# Patient Record
Sex: Male | Born: 1985 | Race: White | Hispanic: No | Marital: Single | Smoking: Current every day smoker
Health system: Southern US, Community
[De-identification: ages and names within clinical notes are randomized; demographics above are authoritative.]

## PROBLEM LIST (undated history)

## (undated) DIAGNOSIS — F419 Anxiety disorder, unspecified: Secondary | ICD-10-CM

## (undated) DIAGNOSIS — F32A Depression, unspecified: Secondary | ICD-10-CM

## (undated) DIAGNOSIS — F329 Major depressive disorder, single episode, unspecified: Secondary | ICD-10-CM

## (undated) DIAGNOSIS — F319 Bipolar disorder, unspecified: Secondary | ICD-10-CM

## (undated) DIAGNOSIS — T7840XA Allergy, unspecified, initial encounter: Secondary | ICD-10-CM

## (undated) HISTORY — DX: Allergy, unspecified, initial encounter: T78.40XA

---

## 2010-07-12 ENCOUNTER — Emergency Department: Payer: Self-pay | Admitting: Emergency Medicine

## 2010-11-16 ENCOUNTER — Emergency Department: Payer: Self-pay | Admitting: Emergency Medicine

## 2014-10-18 ENCOUNTER — Emergency Department
Admission: EM | Admit: 2014-10-18 | Discharge: 2014-10-18 | Disposition: A | Discharge status: Home / Self Care | Payer: Self-pay | Attending: Emergency Medicine | Admitting: Emergency Medicine

## 2014-10-18 ENCOUNTER — Encounter: Payer: Self-pay | Admitting: Emergency Medicine

## 2014-10-18 DIAGNOSIS — Z7251 High risk heterosexual behavior: Secondary | ICD-10-CM

## 2014-10-18 DIAGNOSIS — Z7252 High risk homosexual behavior: Secondary | ICD-10-CM | POA: Insufficient documentation

## 2014-10-18 DIAGNOSIS — N3001 Acute cystitis with hematuria: Secondary | ICD-10-CM | POA: Insufficient documentation

## 2014-10-18 DIAGNOSIS — Z720 Tobacco use: Secondary | ICD-10-CM | POA: Insufficient documentation

## 2014-10-18 HISTORY — DX: Anxiety disorder, unspecified: F41.9

## 2014-10-18 HISTORY — DX: Major depressive disorder, single episode, unspecified: F32.9

## 2014-10-18 HISTORY — DX: Bipolar disorder, unspecified: F31.9

## 2014-10-18 HISTORY — DX: Depression, unspecified: F32.A

## 2014-10-18 LAB — CHLAMYDIA/NGC RT PCR (ARMC ONLY)
Chlamydia Tr: NOT DETECTED
N gonorrhoeae: DETECTED — AB

## 2014-10-18 LAB — URINALYSIS COMPLETE WITH MICROSCOPIC (ARMC ONLY)
BACTERIA UA: NONE SEEN
Bilirubin Urine: NEGATIVE
Glucose, UA: NEGATIVE mg/dL
HGB URINE DIPSTICK: NEGATIVE
Ketones, ur: NEGATIVE mg/dL
Nitrite: NEGATIVE
PH: 5 (ref 5.0–8.0)
Protein, ur: 30 mg/dL — AB
RBC / HPF: NONE SEEN RBC/hpf (ref 0–5)
Specific Gravity, Urine: 1.025 (ref 1.005–1.030)

## 2014-10-18 MED ORDER — LIDOCAINE HCL (PF) 1 % IJ SOLN
2.1000 mL | Freq: Once | INTRAMUSCULAR | Status: AC
  Administered 2014-10-18: 2.1 mL
  Filled 2014-10-18: qty 5

## 2014-10-18 MED ORDER — CEFTRIAXONE SODIUM 1 G IJ SOLR
500.0000 mg | Freq: Once | INTRAMUSCULAR | Status: AC
  Administered 2014-10-18: 500 mg via INTRAMUSCULAR
  Filled 2014-10-18: qty 10

## 2014-10-18 MED ORDER — AZITHROMYCIN 250 MG PO TABS
1000.0000 mg | ORAL_TABLET | Freq: Once | ORAL | Status: AC
  Administered 2014-10-18: 1000 mg via ORAL
  Filled 2014-10-18: qty 4

## 2014-10-18 NOTE — ED Provider Notes (Signed)
[facility] Emergency Department Provider Note ____________________________________________  Time seen: Approximately 1:33 PM  I have reviewed the triage vital signs and the nursing notes.   HISTORY  Chief Complaint Urinary Frequency  HPI Allen Clay is a 29 y.o. male who presents to the ER for dysuria x 2 weeks. He had unprotected intercourse with a male about  3 weeks ago. Symptoms started shortly thereafter. No fever or back pain.    Past Medical History  Diagnosis Date  . Anxiety   . Depression   . Bipolar 1 disorder     There are no active problems to display for this patient.   History reviewed. No pertinent past surgical history.  No current outpatient prescriptions on file.  Allergies Review of patient's allergies indicates no known allergies.  History reviewed. No pertinent family history.  Social History Social History  Substance Use Topics  . Smoking status: Current Every Day Smoker  . Smokeless tobacco: None  . Alcohol Use: Yes    Review of Systems Constitutional: No fever/chills Eyes: No visual changes. ENT: No sore throat. Cardiovascular: Denies chest pain. Respiratory: Denies shortness of breath. Gastrointestinal: No abdominal pain.  No nausea, no vomiting.  No diarrhea.  No constipation. Genitourinary: Positive for dysuria. Musculoskeletal: Negative for back pain. Skin: Negative for rash. Neurological: Negative for headaches, focal weakness or numbness.  10-point ROS otherwise negative.  ____________________________________________   PHYSICAL EXAM:  VITAL SIGNS: ED Triage Vitals  Enc Vitals Group     BP 10/18/14 1113 127/79 mmHg     Pulse Rate 10/18/14 1113 96     Resp 10/18/14 1113 20     Temp 10/18/14 1113 98.4 F (36.9 C)     Temp Source 10/18/14 1113 Oral     SpO2 10/18/14 1113 99 %     Weight 10/18/14 1113 145 lb (65.772 kg)     Height 10/18/14 1113 5\' 6"  (1.676 m)     Head Cir --      Peak Flow  --      Pain Score 10/18/14 1115 0     Pain Loc --      Pain Edu? --      Excl. in GC? --     Constitutional: Alert and oriented. Well appearing and in no acute distress. Eyes: Conjunctivae are normal. PERRL. EOMI. Head: Atraumatic. Nose: No congestion/rhinnorhea. Mouth/Throat: Mucous membranes are moist.  Neck: No stridor.   Cardiovascular: Good peripheral circulation. Respiratory: Normal respiratory effort.  No retractions. Gastrointestinal: Soft and nontender. No distention. No abdominal bruits. No CVA tenderness. Musculoskeletal: No lower extremity tenderness nor edema.  No joint effusions. Neurologic:  Normal speech and language. No gross focal neurologic deficits are appreciated. No gait instability. Skin:  Skin is warm, dry and intact. No rash noted. Psychiatric: Mood and affect are normal. Speech and behavior are normal.  ____________________________________________   LABS (all labs ordered are listed, but only abnormal results are displayed)  Labs Reviewed  URINALYSIS COMPLETEWITH MICROSCOPIC ([facility] ONLY) - Abnormal; Notable for the following:    Color, Urine YELLOW (*)    APPearance CLOUDY (*)    Protein, ur 30 (*)    Leukocytes, UA 3+ (*)    Squamous Epithelial / LPF 0-5 (*)    All other components within normal limits  CHLAMYDIA/NGC RT PCR ([facility] ONLY)  WBC too numerus to count. ____________________________________________  EKG   ____________________________________________  RADIOLOGY   ____________________________________________   PROCEDURES  Procedure(s) performed: None  Critical Care  performed: No  ____________________________________________   INITIAL IMPRESSION / ASSESSMENT AND PLAN / ED COURSE  Pertinent labs & imaging results that were available during my care of the patient were reviewed by me and considered in my medical decision making (see chart for details).  IM Rocephin and 1g Azithromycin given in the ER. Patient  advised of the [facility] ST[facility]. ____________________________________________   FINAL CLINICAL IMPRESSION(S) / ED DIAGNOSES  Final diagnoses:  Acute cystitis with hematuria  Unprotected sexual intercourse      Chinita Pester, FNP 10/18/14 1340  Sharyn Creamer, MD 10/18/14 [id number]

## 2014-10-18 NOTE — ED Notes (Signed)
Pt to ed with c/o burning with urination x 2 weeks.

## 2014-10-20 ENCOUNTER — Telehealth: Payer: Self-pay | Admitting: Emergency Medicine

## 2014-10-20 NOTE — ED Notes (Signed)
  Voicemail said name was Allen Clay, so did not leave a message.  Will send letter.  Would like to inform of positive gonorrhea test--patient was treated in the ED, but needs to be informed that partner treatment is needed and available at [facility] std clinic.

## 2015-02-16 DIAGNOSIS — J069 Acute upper respiratory infection, unspecified: Secondary | ICD-10-CM | POA: Insufficient documentation

## 2015-02-16 DIAGNOSIS — R42 Dizziness and giddiness: Secondary | ICD-10-CM | POA: Insufficient documentation

## 2015-02-16 DIAGNOSIS — F17200 Nicotine dependence, unspecified, uncomplicated: Secondary | ICD-10-CM | POA: Insufficient documentation

## 2015-02-16 LAB — POCT RAPID STREP A: Streptococcus, Group A Screen (Direct): NEGATIVE

## 2015-02-16 NOTE — ED Notes (Signed)
Sore throat x 2 days, congestion, and cough.

## 2015-02-17 ENCOUNTER — Emergency Department
Admission: EM | Admit: 2015-02-17 | Discharge: 2015-02-17 | Disposition: A | Discharge status: Home / Self Care | Payer: Self-pay | Attending: Emergency Medicine | Admitting: Emergency Medicine

## 2015-02-17 DIAGNOSIS — J069 Acute upper respiratory infection, unspecified: Secondary | ICD-10-CM

## 2015-02-17 DIAGNOSIS — J029 Acute pharyngitis, unspecified: Secondary | ICD-10-CM

## 2015-02-17 MED ORDER — BENZONATATE 100 MG PO CAPS: 100.0000 mg | ORAL_CAPSULE | Freq: Four times a day (QID) | ORAL | Status: AC | PRN

## 2015-02-17 NOTE — ED Provider Notes (Signed)
[facility] Emergency Department Provider Note  ____________________________________________  Time seen: Approximately 0058 AM  I have reviewed the triage vital signs and the nursing notes.   HISTORY  Chief Complaint Sore Throat    HPI Allen Clay is a 30 y.o. male who comes into the hospital today with a chest cold. He reports that his body feels weak and his nose is running faucet. The patient reports he's had these symptoms for the past 2 days he has not taken anything for it. The patient denies sick contacts but reports that he works and he is unsure if anyone was sick. The patient has not had any fevers and reports that when he stands he feels a little bit dizzy. He's been eating okay but reports that he only eats well every other day. He's been coughing up some dark mucus with some mild shortness of breath as well as a sore throat. The patient was concerned so he decided to come in to get his symptoms checked out.   Past Medical History  Diagnosis Date  . Anxiety   . Depression   . Bipolar 1 disorder     There are no active problems to display for this patient.   No past surgical history on file.  Current Outpatient Rx  Name  Route  Sig  Dispense  Refill  . benzonatate (TESSALON PERLES) 100 MG capsule   Oral   Take 1 capsule (100 mg total) by mouth every 6 (six) hours as needed for cough.   15 capsule   0     Allergies Review of patient's allergies indicates no known allergies.  No family history on file.  Social History Social History  Substance Use Topics  . Smoking status: Current Every Day Smoker  . Smokeless tobacco: Not on file  . Alcohol Use: Yes    Review of Systems Constitutional: No fever/chills Eyes: No visual changes. ENT:  sore throat. Cardiovascular: Denies chest pain. Respiratory: Cough with mild shortness of breath. Gastrointestinal: No abdominal pain.  No nausea, no vomiting.  No diarrhea.  No  constipation. Genitourinary: Negative for dysuria. Musculoskeletal: Negative for back pain. Skin: Negative for rash. Neurological: Generalized weakness and mild light headedness  10-point ROS otherwise negative.  ____________________________________________   PHYSICAL EXAM:  VITAL SIGNS: ED Triage Vitals  Enc Vitals Group     BP 02/16/15 2225 127/86 mmHg     Pulse Rate 02/16/15 2225 102     Resp 02/16/15 2225 18     Temp 02/16/15 2225 98.1 F (36.7 C)     Temp Source 02/16/15 2225 Oral     SpO2 02/16/15 2225 96 %     Weight 02/16/15 2225 145 lb (65.772 kg)     Height 02/16/15 2225 5\' 6"  (1.676 m)     Head Cir --      Peak Flow --      Pain Score 02/16/15 2227 7     Pain Loc --      Pain Edu? --      Excl. in GC? --     Constitutional: Alert and oriented. Well appearing and in no acute distress. Eyes: Conjunctivae are normal. PERRL. EOMI. Head: Atraumatic. Nose: No congestion/rhinnorhea. Mouth/Throat: Mucous membranes are moist.  Oropharynx non-erythematous. Cardiovascular: Normal rate, regular rhythm. Grossly normal heart sounds.  Good peripheral circulation. Lymph: anterior cervical lymphadenopathy Respiratory: Normal respiratory effort.  No retractions. Lungs CTAB. Gastrointestinal: Soft and nontender. No distention. Positive bowel sounds Musculoskeletal: No lower  extremity tenderness nor edema.   Neurologic:  Normal speech and language.  Skin:  Skin is warm, dry and intact.  Psychiatric: Mood and affect are normal.  ____________________________________________   LABS (all labs ordered are listed, but only abnormal results are displayed)  Labs Reviewed  POCT RAPID STREP A   ____________________________________________  EKG  none ____________________________________________  RADIOLOGY  none ____________________________________________   PROCEDURES  Procedure(s) performed: None  Critical Care performed:  No  ____________________________________________   INITIAL IMPRESSION / ASSESSMENT AND PLAN / ED COURSE  Pertinent labs & imaging results that were available during my care of the patient were reviewed by me and considered in my medical decision making (see chart for details).  This is a 30 year old male who comes into the hospital today with upper respiratory infection symptoms such as runny nose cough and some sore throat. The patient did have a strep test done that was negative. He reports that he's been having these symptoms for the past 2 days and has not taken anything to help with the symptoms. I feel the patient has an upper respiratory infection and he needs to rest and take by mouth fluid. I will give the patient some benzonatate for his cough and congestion and give him a note for work. Otherwise the patient will be discharged home he has no fevers and he is in no acute distress at this time. The patient be discharged home. He understands and verbalizes agreement with the plan as stated. ____________________________________________   FINAL CLINICAL IMPRESSION(S) / ED DIAGNOSES  Final diagnoses:  Upper respiratory infection  Sore throat      Rebecka Apley, MD 02/17/15 0127

## 2015-02-17 NOTE — Discharge Instructions (Signed)
Upper Respiratory Infection, Adult  Most upper respiratory infections (URIs) are a viral infection of the air passages leading to the lungs. A URI affects the nose, throat, and upper air passages. The most common type of URI is nasopharyngitis and is typically referred to as "the common cold."  URIs run their course and usually go away on their own. Most of the time, a URI does not require medical attention, but sometimes a bacterial infection in the upper airways can follow a viral infection. This is called a secondary infection. Sinus and middle ear infections are common types of secondary upper respiratory infections.  Bacterial pneumonia can also complicate a URI. A URI can worsen asthma and chronic obstructive pulmonary disease (COPD). Sometimes, these complications can require emergency medical care and may be life threatening.   CAUSES  Almost all URIs are caused by viruses. A virus is a type of germ and can spread from one person to another.   RISKS FACTORS  You may be at risk for a URI if:    You smoke.    You have chronic heart or lung disease.   You have a weakened defense (immune) system.    You are very young or very old.    You have nasal allergies or asthma.   You work in crowded or poorly ventilated areas.   You work in health care facilities or schools.  SIGNS AND SYMPTOMS   Symptoms typically develop 2-3 days after you come in contact with a cold virus. Most viral URIs last 7-10 days. However, viral URIs from the influenza virus (flu virus) can last 14-18 days and are typically more severe. Symptoms may include:    Runny or stuffy (congested) nose.    Sneezing.    Cough.    Sore throat.    Headache.    Fatigue.    Fever.    Loss of appetite.    Pain in your forehead, behind your eyes, and over your cheekbones (sinus pain).   Muscle aches.   DIAGNOSIS   Your health care provider may diagnose a URI by:   Physical exam.   Tests to check that your symptoms are not due to  another condition such as:   Strep throat.   Sinusitis.   Pneumonia.   Asthma.  TREATMENT   A URI goes away on its own with time. It cannot be cured with medicines, but medicines may be prescribed or recommended to relieve symptoms. Medicines may help:   Reduce your fever.   Reduce your cough.   Relieve nasal congestion.  HOME CARE INSTRUCTIONS    Take medicines only as directed by your health care provider.    Gargle warm saltwater or take cough drops to comfort your throat as directed by your health care provider.   Use a warm mist humidifier or inhale steam from a shower to increase air moisture. This may make it easier to breathe.   Drink enough fluid to keep your urine clear or pale yellow.    Eat soups and other clear broths and maintain good nutrition.    Rest as needed.    Return to work when your temperature has returned to normal or as your health care provider advises. You may need to stay home longer to avoid infecting others. You can also use a face mask and careful hand washing to prevent spread of the virus.   Increase the usage of your inhaler if you have asthma.    Do not   use any tobacco products, including cigarettes, chewing tobacco, or electronic cigarettes. If you need help quitting, ask your health care provider.  PREVENTION   The best way to protect yourself from getting a cold is to practice good hygiene.    Avoid oral or hand contact with people with cold symptoms.    Wash your hands often if contact occurs.   There is no clear evidence that vitamin C, vitamin E, echinacea, or exercise reduces the chance of developing a cold. However, it is always recommended to get plenty of rest, exercise, and practice good nutrition.   SEEK MEDICAL CARE IF:    You are getting worse rather than better.    Your symptoms are not controlled by medicine.    You have chills.   You have worsening shortness of breath.   You have brown or red mucus.   You have yellow or brown nasal  discharge.   You have pain in your face, especially when you bend forward.   You have a fever.   You have swollen neck glands.   You have pain while swallowing.   You have white areas in the back of your throat.  SEEK IMMEDIATE MEDICAL CARE IF:    You have severe or persistent:    Headache.    Ear pain.    Sinus pain.    Chest pain.   You have chronic lung disease and any of the following:    Wheezing.    Prolonged cough.    Coughing up blood.    A change in your usual mucus.   You have a stiff neck.   You have changes in your:    Vision.    Hearing.    Thinking.    Mood.  MAKE SURE YOU:    Understand these instructions.   Will watch your condition.   Will get help right away if you are not doing well or get worse.     This information is not intended to replace advice given to you by your health care provider. Make sure you discuss any questions you have with your health care provider.     Document Released: 07/20/2000 Document Revised: 06/10/2014 Document Reviewed: 05/01/2013  Elsevier Interactive Patient Education 2016 [employer]  Pharyngitis  Pharyngitis is redness, pain, and swelling (inflammation) of your pharynx.   CAUSES   Pharyngitis is usually caused by infection. Most of the time, these infections are from viruses (viral) and are part of a cold. However, sometimes pharyngitis is caused by bacteria (bacterial). Pharyngitis can also be caused by allergies. Viral pharyngitis may be spread from person to person by coughing, sneezing, and personal items or utensils (cups, forks, spoons, toothbrushes). Bacterial pharyngitis may be spread from person to person by more intimate contact, such as kissing.   SIGNS AND SYMPTOMS   Symptoms of pharyngitis include:    Sore throat.    Tiredness (fatigue).    Low-grade fever.    Headache.   Joint pain and muscle aches.   Skin rashes.   Swollen lymph nodes.   Plaque-like film on throat or tonsils (often seen with bacterial  pharyngitis).  DIAGNOSIS   Your health care provider will ask you questions about your illness and your symptoms. Your medical history, along with a physical exam, is often all that is needed to diagnose pharyngitis. Sometimes, a rapid strep test is done. Other lab tests may also be done, depending on the suspected cause.   TREATMENT   Viral   pharyngitis will usually get better in 3-4 days without the use of medicine. Bacterial pharyngitis is treated with medicines that kill germs (antibiotics).   HOME CARE INSTRUCTIONS    Drink enough water and fluids to keep your urine clear or pale yellow.    Only take over-the-counter or prescription medicines as directed by your health care provider:     If you are prescribed antibiotics, make sure you finish them even if you start to feel better.     Do not take aspirin.    Get lots of rest.    Gargle with 8 oz of salt water ( tsp of salt per 1 qt of water) as often as every 1-2 hours to soothe your throat.    Throat lozenges (if you are not at risk for choking) or sprays may be used to soothe your throat.  SEEK MEDICAL CARE IF:    You have large, tender lumps in your neck.   You have a rash.   You cough up green, yellow-brown, or bloody spit.  SEEK IMMEDIATE MEDICAL CARE IF:    Your neck becomes stiff.   You drool or are unable to swallow liquids.   You vomit or are unable to keep medicines or liquids down.   You have severe pain that does not go away with the use of recommended medicines.   You have trouble breathing (not caused by a stuffy nose).  MAKE SURE YOU:    Understand these instructions.   Will watch your condition.   Will get help right away if you are not doing well or get worse.     This information is not intended to replace advice given to you by your health care provider. Make sure you discuss any questions you have with your health care provider.     Document Released: 01/24/2005 Document Revised: 11/14/2012 Document Reviewed:  10/01/2012  Elsevier Interactive Patient Education 2016 [employer] Inc[employer]

## 2015-02-17 NOTE — ED Notes (Signed)
Patient discharged to home per MD order. Patient in stable condition, and deemed medically cleared by ED provider for discharge. Discharge instructions reviewed with patient/family using "Teach Back"; verbalized understanding of medication education and administration, and information about follow-up care. Denies further concerns. ° °

## 2015-03-30 ENCOUNTER — Encounter: Payer: Self-pay | Admitting: Emergency Medicine

## 2015-03-30 DIAGNOSIS — F17210 Nicotine dependence, cigarettes, uncomplicated: Secondary | ICD-10-CM | POA: Insufficient documentation

## 2015-03-30 DIAGNOSIS — R109 Unspecified abdominal pain: Secondary | ICD-10-CM | POA: Insufficient documentation

## 2015-03-30 LAB — URINALYSIS COMPLETE WITH MICROSCOPIC (ARMC ONLY)
BILIRUBIN URINE: NEGATIVE
Bacteria, UA: NONE SEEN
GLUCOSE, UA: NEGATIVE mg/dL
HGB URINE DIPSTICK: NEGATIVE
Ketones, ur: NEGATIVE mg/dL
LEUKOCYTES UA: NEGATIVE
NITRITE: NEGATIVE
Protein, ur: NEGATIVE mg/dL
RBC / HPF: NONE SEEN RBC/hpf (ref 0–5)
SPECIFIC GRAVITY, URINE: 1.014 (ref 1.005–1.030)
Squamous Epithelial / LPF: NONE SEEN
pH: 6 (ref 5.0–8.0)

## 2015-03-30 LAB — CBC
HCT: 41.1 % (ref 40.0–52.0)
Hemoglobin: 13.9 g/dL (ref 13.0–18.0)
MCH: 31.1 pg (ref 26.0–34.0)
MCHC: 33.8 g/dL (ref 32.0–36.0)
MCV: 91.8 fL (ref 80.0–100.0)
PLATELETS: 154 10*3/uL (ref 150–440)
RBC: 4.47 MIL/uL (ref 4.40–5.90)
RDW: 13 % (ref 11.5–14.5)
WBC: 5.9 10*3/uL (ref 3.8–10.6)

## 2015-03-30 LAB — COMPREHENSIVE METABOLIC PANEL
ALT: 17 U/L (ref 17–63)
AST: 25 U/L (ref 15–41)
Albumin: 4.1 g/dL (ref 3.5–5.0)
Alkaline Phosphatase: 43 U/L (ref 38–126)
Anion gap: 2 — ABNORMAL LOW (ref 5–15)
BILIRUBIN TOTAL: 0.7 mg/dL (ref 0.3–1.2)
BUN: 9 mg/dL (ref 6–20)
CO2: 31 mmol/L (ref 22–32)
CREATININE: 0.86 mg/dL (ref 0.61–1.24)
Calcium: 8.9 mg/dL (ref 8.9–10.3)
Chloride: 106 mmol/L (ref 101–111)
GFR calc Af Amer: >60 mL/min (ref >60)
Glucose, Bld: 95 mg/dL (ref 65–99)
Potassium: 4.2 mmol/L (ref 3.5–5.1)
Sodium: 139 mmol/L (ref 135–145)
TOTAL PROTEIN: 6.6 g/dL (ref 6.5–8.1)

## 2015-03-30 LAB — LIPASE, BLOOD: Lipase: 27 U/L (ref 11–51)

## 2015-03-30 NOTE — ED Notes (Addendum)
Patient ambulatory to triage with steady gait, without difficulty or distress noted; pt reports N/V/D since this morning with rt sided abd pain

## 2015-03-31 ENCOUNTER — Emergency Department
Admission: EM | Admit: 2015-03-31 | Discharge: 2015-03-31 | Disposition: A | Discharge status: Home / Self Care | Payer: Self-pay | Attending: Emergency Medicine | Admitting: Emergency Medicine

## 2016-07-28 ENCOUNTER — Emergency Department
Admission: EM | Admit: 2016-07-28 | Discharge: 2016-07-28 | Disposition: A | Discharge status: Home / Self Care | Payer: Self-pay | Attending: Emergency Medicine | Admitting: Emergency Medicine

## 2016-07-28 ENCOUNTER — Encounter: Payer: Self-pay | Admitting: Emergency Medicine

## 2016-07-28 ENCOUNTER — Emergency Department: Payer: Self-pay

## 2016-07-28 DIAGNOSIS — R52 Pain, unspecified: Secondary | ICD-10-CM

## 2016-07-28 DIAGNOSIS — R1013 Epigastric pain: Secondary | ICD-10-CM | POA: Insufficient documentation

## 2016-07-28 DIAGNOSIS — F17210 Nicotine dependence, cigarettes, uncomplicated: Secondary | ICD-10-CM | POA: Insufficient documentation

## 2016-07-28 LAB — URINALYSIS, COMPLETE (UACMP) WITH MICROSCOPIC
BACTERIA UA: NONE SEEN
Bilirubin Urine: NEGATIVE
Glucose, UA: NEGATIVE mg/dL
HGB URINE DIPSTICK: NEGATIVE
Ketones, ur: NEGATIVE mg/dL
Leukocytes, UA: NEGATIVE
NITRITE: NEGATIVE
PROTEIN: NEGATIVE mg/dL
RBC / HPF: NONE SEEN RBC/hpf (ref 0–5)
SPECIFIC GRAVITY, URINE: 1.024 (ref 1.005–1.030)
SQUAMOUS EPITHELIAL / LPF: NONE SEEN
pH: 5 (ref 5.0–8.0)

## 2016-07-28 LAB — COMPREHENSIVE METABOLIC PANEL
ALBUMIN: 4.5 g/dL (ref 3.5–5.0)
ALK PHOS: 36 U/L — AB (ref 38–126)
ALT: 17 U/L (ref 17–63)
AST: 30 U/L (ref 15–41)
Anion gap: 6 (ref 5–15)
BILIRUBIN TOTAL: 0.7 mg/dL (ref 0.3–1.2)
BUN: 11 mg/dL (ref 6–20)
CALCIUM: 9.2 mg/dL (ref 8.9–10.3)
CO2: 29 mmol/L (ref 22–32)
Chloride: 102 mmol/L (ref 101–111)
Creatinine, Ser: 1.15 mg/dL (ref 0.61–1.24)
GFR calc Af Amer: >60 mL/min (ref >60)
GFR calc non Af Amer: >60 mL/min (ref >60)
GLUCOSE: 90 mg/dL (ref 65–99)
POTASSIUM: 3.4 mmol/L — AB (ref 3.5–5.1)
SODIUM: 137 mmol/L (ref 135–145)
TOTAL PROTEIN: 7.3 g/dL (ref 6.5–8.1)

## 2016-07-28 LAB — CBC
HCT: 43.2 % (ref 40.0–52.0)
Hemoglobin: 15 g/dL (ref 13.0–18.0)
MCH: 31.9 pg (ref 26.0–34.0)
MCHC: 34.7 g/dL (ref 32.0–36.0)
MCV: 91.9 fL (ref 80.0–100.0)
Platelets: 178 10*3/uL (ref 150–440)
RBC: 4.7 MIL/uL (ref 4.40–5.90)
RDW: 12.9 % (ref 11.5–14.5)
WBC: 8.4 10*3/uL (ref 3.8–10.6)

## 2016-07-28 LAB — LIPASE, BLOOD: Lipase: 25 U/L (ref 11–51)

## 2016-07-28 MED ORDER — DICYCLOMINE HCL 20 MG PO TABS
20.0000 mg | ORAL_TABLET | Freq: Once | ORAL | Status: AC
  Administered 2016-07-28: 20 mg via ORAL
  Filled 2016-07-28 (×2): qty 1

## 2016-07-28 MED ORDER — FAMOTIDINE 20 MG PO TABS: 20.0000 mg | ORAL_TABLET | Freq: Two times a day (BID) | ORAL | 0 refills | Status: AC

## 2016-07-28 MED ORDER — IBUPROFEN 400 MG PO TABS
600.0000 mg | ORAL_TABLET | Freq: Once | ORAL | Status: AC
  Administered 2016-07-28: 600 mg via ORAL
  Filled 2016-07-28: qty 2

## 2016-07-28 NOTE — ED Provider Notes (Signed)
[facility] Emergency Department Provider Note  ____________________________________________   First MD Initiated Contact with Patient 07/28/16 2154     (approximate)  I have reviewed the triage vital signs and the nursing notes.   HISTORY  Chief Complaint Abdominal Pain    HPI Allen Clay is a 31 y.o. male who presents to the emergency department with 4 days of postprandial epigastric burning aching discomfort. Associated with nausea and intermittent vomiting. Associated with daily loose stools. He said no fevers or chills. No history of abdominal surgeries. No flank pain. The pain does not radiate. It is not positional.He is particularly concerned because a coworker of his recently had appendicitis and he does not want to have surgery.   Past Medical History:  Diagnosis Date  . Anxiety   . Bipolar 1 disorder (HCC)   . Depression     There are no active problems to display for this patient.   History reviewed. No pertinent surgical history.  Prior to Admission medications   Medication Sig Start Date End Date Taking? Authorizing Provider  benzonatate (TESSALON PERLES) 100 MG capsule Take 1 capsule (100 mg total) by mouth every 6 (six) hours as needed for cough. 02/17/15   Rebecka Apley, MD  famotidine (PEPCID) 20 MG tablet Take 1 tablet (20 mg total) by mouth 2 (two) times daily. 07/28/16 07/28/17  Merrily Brittle, MD    Allergies Patient has no known allergies.  History reviewed. No pertinent family history.  Social History Social History  Substance Use Topics  . Smoking status: Current Every Day Smoker    Packs/day: 1.00    Types: Cigarettes  . Smokeless tobacco: Never Used  . Alcohol use 4.2 oz/week    7 Cans of beer per week     Comment: occasional    Review of Systems Constitutional: No fever/chills Eyes: No visual changes. ENT: No sore throat. Cardiovascular: Denies chest pain. Respiratory: Denies shortness of  breath. Gastrointestinal: Positive abdominal pain.  Positive nausea, positive vomiting.  Positive diarrhea.  No constipation. Genitourinary: Negative for dysuria. Musculoskeletal: Negative for back pain. Skin: Negative for rash. Neurological: Negative for headaches, focal weakness or numbness.   ____________________________________________   PHYSICAL EXAM:  VITAL SIGNS: ED Triage Vitals [07/28/16 2115]  Enc Vitals Group     BP 125/79     Pulse Rate 88     Resp 18     Temp 98.4 F (36.9 C)     Temp Source Oral     SpO2 96 %     Weight 140 lb (63.5 kg)     Height 5\' 6"  (1.676 m)     Head Circumference      Peak Flow      Pain Score 8     Pain Loc      Pain Edu?      Excl. in GC?     Constitutional: Alert and oriented 4 well appearing nontoxic no diaphoresis speaks in full clear sentences Eyes: PERRL EOMI. Head: Atraumatic. Nose: No congestion/rhinnorhea. Mouth/Throat: No trismus Neck: No stridor.   Cardiovascular: Normal rate, regular rhythm. Grossly normal heart sounds.  Good peripheral circulation. Respiratory: Normal respiratory effort.  No retractions. Lungs CTAB and moving good air Gastrointestinal: Soft nondistended nontender no rebound or guarding no peritonitis no McBurney's tenderness negative Rovsing's negative Murphy's no costovertebral tenderness Musculoskeletal: No lower extremity edema   Neurologic:  Normal speech and language. No gross focal neurologic deficits are appreciated. Skin:  Skin is warm,  dry and intact. No rash noted. Psychiatric: Mood and affect are normal. Speech and behavior are normal.    ____________________________________________   DIFFERENTIAL  Biliary colic, cholecystitis, cholangitis, pancreatitis, acid reflux ____________________________________________   LABS (all labs ordered are listed, but only abnormal results are displayed)  Labs Reviewed  COMPREHENSIVE METABOLIC PANEL - Abnormal; Notable for the following:        Result Value   Potassium 3.4 (*)    Alkaline Phosphatase 36 (*)    All other components within normal limits  URINALYSIS, COMPLETE (UACMP) WITH MICROSCOPIC - Abnormal; Notable for the following:    Color, Urine YELLOW (*)    APPearance CLEAR (*)    All other components within normal limits  LIPASE, BLOOD  CBC    Clinically insignificant hypokalemia  EKG   ____________________________________________  RADIOLOGY  Right upper quadrant ultrasound normal   PROCEDURES  Procedure(s) performed: no  Procedures  Critical Care performed: no  Observation: no ____________________________________________   INITIAL IMPRESSION / ASSESSMENT AND PLAN / ED COURSE  Pertinent labs & imaging results that were available during my care of the patient were reviewed by me and considered in my medical decision making (see chart for details).  The patient is postprandial pain nausea and vomiting. Fortunately his right upper quadrant ultrasound is normal and his labs are unremarkable. He may very well have a gastric ulcer so I will treat him with an H2 blocker for the next week and referred to primary care. Strict return precautions given and he verbalizes understanding and agreement with the plan.      ____________________________________________   FINAL CLINICAL IMPRESSION(S) / ED DIAGNOSES  Final diagnoses:  Pain  Epigastric pain      NEW MEDICATIONS STARTED DURING THIS VISIT:  New Prescriptions   FAMOTIDINE (PEPCID) 20 MG TABLET    Take 1 tablet (20 mg total) by mouth 2 (two) times daily.     Note:  This document was prepared using Dragon voice recognition software and may include unintentional dictation errors.     Merrily Brittleifenbark, Kataya Guimont, MD 07/28/16 2253

## 2016-07-28 NOTE — ED Triage Notes (Signed)
Pt to ED from home c/o mid abd pain x3 days with n/v/d.  States worse after eating or drinking.  Denies urinary symptoms, denies SOB.  Pt A&Ox4, chest rise even and unlabored, and in no obvious distress in triage.

## 2016-07-28 NOTE — Discharge Instructions (Signed)
Please take your antacid twice a day for the next week and follow up with your primary care physician and that time for recheck. Return to the emergency department sooner for any new or worsening symptoms such as fevers, chills, if you cannot eat or drink, or for any other concerns.  It was a pleasure to take care of you today, and thank you for coming to our emergency department.  If you have any questions or concerns before leaving please ask the nurse to grab me and I'm more than happy to go through your aftercare instructions again.  If you were prescribed any opioid pain medication today such as Norco, Vicodin, Percocet, morphine, hydrocodone, or oxycodone please make sure you do not drive when you are taking this medication as it can alter your ability to drive safely.  If you have any concerns once you are home that you are not improving or are in fact getting worse before you can make it to your follow-up appointment, please do not hesitate to call 911 and come back for further evaluation.  Madylin Fairbank MD  Results for oMerrily Brittlerders placed or performed during the hospital encounter of 07/28/16  Lipase, blood  Result Value Ref Range   Lipase 25 11 - 51 U/L  Comprehensive metabolic panel  Result Value Ref Range   Sodium 137 135 - 145 mmol/L   Potassium 3.4 (L) 3.5 - 5.1 mmol/L   Chloride 102 101 - 111 mmol/L   CO2 29 22 - 32 mmol/L   Glucose, Bld 90 65 - 99 mg/dL   BUN 11 6 - 20 mg/dL   Creatinine, Ser 1.321.15 0.61 - 1.24 mg/dL   Calcium 9.2 8.9 - 44.010.3 mg/dL   Total Protein 7.3 6.5 - 8.1 g/dL   Albumin 4.5 3.5 - 5.0 g/dL   AST 30 15 - 41 U/L   ALT 17 17 - 63 U/L   Alkaline Phosphatase 36 (L) 38 - 126 U/L   Total Bilirubin 0.7 0.3 - 1.2 mg/dL   GFR calc non Af Amer >60 >60 mL/min   GFR calc Af Amer >60 >60 mL/min   Anion gap 6 5 - 15  CBC  Result Value Ref Range   WBC 8.4 3.8 - 10.6 K/uL   RBC 4.70 4.40 - 5.90 MIL/uL   Hemoglobin 15.0 13.0 - 18.0 g/dL   HCT 10.243.2 72.540.0 - 36.652.0 %   MCV 91.9 80.0 - 100.0 fL   MCH 31.9 26.0 - 34.0 pg   MCHC 34.7 32.0 - 36.0 g/dL   RDW 44.012.9 34.711.5 - 42.514.5 %   Platelets 178 150 - 440 K/uL  Urinalysis, Complete w Microscopic  Result Value Ref Range   Color, Urine YELLOW (A) YELLOW   APPearance CLEAR (A) CLEAR   Specific Gravity, Urine 1.024 1.005 - 1.030   pH 5.0 5.0 - 8.0   Glucose, UA NEGATIVE NEGATIVE mg/dL   Hgb urine dipstick NEGATIVE NEGATIVE   Bilirubin Urine NEGATIVE NEGATIVE   Ketones, ur NEGATIVE NEGATIVE mg/dL   Protein, ur NEGATIVE NEGATIVE mg/dL   Nitrite NEGATIVE NEGATIVE   Leukocytes, UA NEGATIVE NEGATIVE   RBC / HPF NONE SEEN 0 - 5 RBC/hpf   WBC, UA 0-5 0 - 5 WBC/hpf   Bacteria, UA NONE SEEN NONE SEEN   Squamous Epithelial / LPF NONE SEEN NONE SEEN   Mucous PRESENT

## 2020-11-03 ENCOUNTER — Emergency Department
Admission: EM | Admit: 2020-11-03 | Discharge: 2020-11-03 | Disposition: A | Discharge status: Home / Self Care | Payer: Self-pay | Attending: Emergency Medicine | Admitting: Emergency Medicine

## 2020-11-03 ENCOUNTER — Emergency Department: Payer: Self-pay

## 2020-11-03 ENCOUNTER — Other Ambulatory Visit: Payer: Self-pay

## 2020-11-03 DIAGNOSIS — F17210 Nicotine dependence, cigarettes, uncomplicated: Secondary | ICD-10-CM | POA: Insufficient documentation

## 2020-11-03 DIAGNOSIS — S20212A Contusion of left front wall of thorax, initial encounter: Secondary | ICD-10-CM | POA: Insufficient documentation

## 2020-11-03 DIAGNOSIS — X500XXA Overexertion from strenuous movement or load, initial encounter: Secondary | ICD-10-CM | POA: Insufficient documentation

## 2020-11-03 MED ORDER — CYCLOBENZAPRINE HCL 5 MG PO TABS: 5.0000 mg | ORAL_TABLET | Freq: Three times a day (TID) | ORAL | 0 refills | Status: AC | PRN

## 2020-11-03 MED ORDER — IBUPROFEN 800 MG PO TABS: 800.0000 mg | ORAL_TABLET | Freq: Three times a day (TID) | ORAL | 0 refills | Status: AC | PRN

## 2020-11-03 MED ORDER — LIDOCAINE 5 % EX PTCH: 1.0000 | MEDICATED_PATCH | Freq: Two times a day (BID) | CUTANEOUS | 0 refills | Status: AC | PRN

## 2020-11-03 NOTE — ED Triage Notes (Signed)
Pt c/o left rib pain since last week after lifting something at work.

## 2020-11-03 NOTE — ED Provider Notes (Signed)
[facility] Emergency Department Provider Note ____________________________________________  Time seen: 1435  I have reviewed the triage vital signs and the nursing notes.  HISTORY  Chief Complaint  painful rib   HPI Allen Clay is a 35 y.o. male presents to the ED for evaluation of left-sided rib pain and discomfort.  Patient reports onset after he reported lift something heavy last week at work.  He denies any cough, hemoptysis, or syncope.  He notes pain to the anterior lateral left chest that is aggravated by movement, causing pain with sneezing.  Past Medical History:  Diagnosis Date   Anxiety    Bipolar 1 disorder (HCC)    Depression     There are no problems to display for this patient.   History reviewed. No pertinent surgical history.  Prior to Admission medications   Medication Sig Start Date End Date Taking? Authorizing Provider  cyclobenzaprine (FLEXERIL) 5 MG tablet Take 1 tablet (5 mg total) by mouth 3 (three) times daily as needed. 11/03/20  Yes Lincy Belles, Charlesetta Ivory, PA-C  ibuprofen (ADVIL) 800 MG tablet Take 1 tablet (800 mg total) by mouth every 8 (eight) hours as needed. 11/03/20  Yes Gordan Grell, Charlesetta Ivory, PA-C  lidocaine (LIDODERM) 5 % Place 1 patch onto the skin every 12 (twelve) hours as needed for up to 10 days. Remove & Discard patch after 12 hours of wear each day. 11/03/20 11/13/20 Yes Ilda Laskin, Charlesetta Ivory, PA-C  benzonatate (TESSALON PERLES) 100 MG capsule Take 1 capsule (100 mg total) by mouth every 6 (six) hours as needed for cough. 02/17/15   Rebecka Apley, MD  famotidine (PEPCID) 20 MG tablet Take 1 tablet (20 mg total) by mouth 2 (two) times daily. 07/28/16 07/28/17  Merrily Brittle, MD    Allergies Patient has no known allergies.  History reviewed. No pertinent family history.  Social History Social History   Tobacco Use   Smoking status: Every Day    Packs/day: 1.00    Types: Cigarettes   Smokeless tobacco:  Never  Substance Use Topics   Alcohol use: Yes    Alcohol/week: 7.0 standard drinks    Types: 7 Cans of beer per week    Comment: occasional   Drug use: Yes    Types: Marijuana    Review of Systems  Constitutional: Negative for fever. Eyes: Negative for visual changes. ENT: Negative for sore throat. Cardiovascular: Negative for chest pain. Respiratory: Negative for shortness of breath.  Reports left chest wall pain as above. Gastrointestinal: Negative for abdominal pain, vomiting and diarrhea. Genitourinary: Negative for dysuria. Musculoskeletal: Negative for back pain. Skin: Negative for rash. Neurological: Negative for headaches, focal weakness or numbness. ____________________________________________  PHYSICAL EXAM:  VITAL SIGNS: ED Triage Vitals [11/03/20 1315]  Enc Vitals Group     BP (!) 143/99     Pulse Rate 71     Resp 20     Temp 98.3 F (36.8 C)     Temp Source Oral     SpO2 97 %     Weight      Height      Head Circumference      Peak Flow      Pain Score      Pain Loc      Pain Edu?      Excl. in GC?     Constitutional: Alert and oriented. Well appearing and in no distress. Head: Normocephalic and atraumatic. Eyes: Conjunctivae are normal. Normal extraocular  movements Neck: Supple. No thyromegaly. Cardiovascular: Normal rate, regular rhythm. Normal distal pulses. Respiratory: Normal respiratory effort. No wheezes/rales/rhonchi. Gastrointestinal: Soft and nontender. No distention. Musculoskeletal: Nontender with normal range of motion in all extremities.  Neurologic:  Normal gait without ataxia. Normal speech and language. No gross focal neurologic deficits are appreciated. Skin:  Skin is warm, dry and intact. No rash noted. Psychiatric: Mood and affect are normal. Patient exhibits appropriate insight and judgment. ____________________________________________    {LABS (pertinent  positives/negatives)  ____________________________________________  {EKG  ____________________________________________   RADIOLOGY Official radiology report(s): DG Ribs Unilateral W/Chest Left  Result Date: 11/03/2020 CLINICAL DATA:  LEFT rib pain since last week after lifting something at work EXAM: LEFT RIBS AND CHEST - 3+ VIEW COMPARISON:  Chest radiographs 07/12/2010 FINDINGS: Normal heart size, mediastinal contours, and pulmonary vascularity. Lungs clear. No pulmonary infiltrate, pleural effusion, or pneumothorax. Broad-based dextroconvex thoracic scoliosis. Osseous mineralization normal. BB placed at site of symptoms lower LEFT ribs. No rib fracture or bone destruction. IMPRESSION: No acute abnormalities. Broad-based dextroconvex thoracic scoliosis. Electronically Signed   By: Ulyses Southward M.D.   On: 11/03/2020 14:50   ____________________________________________  PROCEDURES   Procedures ____________________________________________   INITIAL IMPRESSION / ASSESSMENT AND PLAN / ED COURSE  As part of my medical decision making, I reviewed the following data within the electronic MEDICAL RECORD NUMBER    DDX: rib fracture, chest wall contusion, pneumothorax  Patient ED evaluation of left-sided chest chest wall and rib pain after mechanical injury last week.  He presents for evaluation of complaints.  Vital signs are stable, patient has a normal x-ray without any signs of acute rib fracture or intrathoracic process.  He will be discharged with a prescription muscle relaxant, anti-inflammatories, and lidocaine pain patches.  Follow with primary provider for ongoing symptoms or return to the ED if needed.  Allen Clay was evaluated in Emergency Department on 11/03/2020 for the symptoms described in the history of present illness. He was evaluated in the context of the global COVID-19 pandemic, which necessitated consideration that the patient might be at risk for infection with the SARS-CoV-2  virus that causes COVID-19. Institutional protocols and algorithms that pertain to the evaluation of patients at risk for COVID-19 are in a state of rapid change based on information released by regulatory bodies including the CDC and federal and state organizations. These policies and algorithms were followed during the patient's care in the ED. ____________________________________________  FINAL CLINICAL IMPRESSION(S) / ED DIAGNOSES  Final diagnoses:  Rib contusion, left, initial encounter      Lissa Hoard, PA-C 11/03/20 1918    Georga Hacking, MD 11/03/20 1942

## 2020-11-03 NOTE — Discharge Instructions (Signed)
Your exam and XR are negative for fracture or lung injury.  Take the prescription meds as directed. Apply ice and/or moist heat for continued symptoms.

## 2020-11-20 ENCOUNTER — Other Ambulatory Visit: Payer: Self-pay

## 2020-11-20 ENCOUNTER — Emergency Department: Payer: Self-pay

## 2020-11-20 ENCOUNTER — Encounter: Payer: Self-pay | Admitting: Emergency Medicine

## 2020-11-20 ENCOUNTER — Emergency Department
Admission: EM | Admit: 2020-11-20 | Discharge: 2020-11-20 | Disposition: A | Discharge status: Home / Self Care | Payer: Self-pay | Attending: Emergency Medicine | Admitting: Emergency Medicine

## 2020-11-20 DIAGNOSIS — S299XXA Unspecified injury of thorax, initial encounter: Secondary | ICD-10-CM | POA: Insufficient documentation

## 2020-11-20 DIAGNOSIS — X58XXXA Exposure to other specified factors, initial encounter: Secondary | ICD-10-CM | POA: Insufficient documentation

## 2020-11-20 DIAGNOSIS — F17210 Nicotine dependence, cigarettes, uncomplicated: Secondary | ICD-10-CM | POA: Insufficient documentation

## 2020-11-20 MED ORDER — PREDNISONE 10 MG (21) PO TBPK: ORAL_TABLET | ORAL | 0 refills | Status: AC

## 2020-11-20 MED ORDER — METHOCARBAMOL 500 MG PO TABS: 500.0000 mg | ORAL_TABLET | Freq: Four times a day (QID) | ORAL | 0 refills | Status: AC

## 2020-11-20 NOTE — ED Triage Notes (Signed)
Having left rib pain was seen for same about 2 weeks ago

## 2020-11-20 NOTE — ED Notes (Signed)
First nurse note  states he was seen about 2 weeks ago  s/p injury to rib area  states he is still having pain to area  denies any new injury

## 2020-11-20 NOTE — Discharge Instructions (Addendum)
Your x-ray does not show a rib fracture. I have prescribed a steroid that should help decrease inflammation that is likely causing your pain. Follow up with primary care for symptoms that are not improving over the week.

## 2020-11-20 NOTE — ED Provider Notes (Signed)
[facility] Emergency Department Provider Note ____________________________________________  Time seen: Approximately 12:56 PM  I have reviewed the triage vital signs and the nursing notes.   HISTORY  Chief Complaint Chest Pain (Rib pain/)    HPI Allen Clay is a 35 y.o. male who presents to the emergency department for evaluation and treatment of left anterior rib pain that started about 2 1/2 weeks ago.  No relief with medications prescribed on last visit.  Pain worsens when he attempts to lift anything with the left arm.  Past Medical History:  Diagnosis Date   Anxiety    Bipolar 1 disorder (HCC)    Depression     There are no problems to display for this patient.   History reviewed. No pertinent surgical history.  Prior to Admission medications   Medication Sig Start Date End Date Taking? Authorizing Provider  methocarbamol (ROBAXIN) 500 MG tablet Take 1 tablet (500 mg total) by mouth 4 (four) times daily. 11/20/20  Yes Hanalei Glace B, FNP  predniSONE (STERAPRED UNI-PAK 21 TAB) 10 MG (21) TBPK tablet Take 6 tablets on the first day and decrease by 1 tablet each day until finished. 11/20/20  Yes Jamyson Jirak B, FNP  benzonatate (TESSALON PERLES) 100 MG capsule Take 1 capsule (100 mg total) by mouth every 6 (six) hours as needed for cough. 02/17/15   Rebecka Apley, MD  cyclobenzaprine (FLEXERIL) 5 MG tablet Take 1 tablet (5 mg total) by mouth 3 (three) times daily as needed. 11/03/20   Menshew, Charlesetta Ivory, PA-C  famotidine (PEPCID) 20 MG tablet Take 1 tablet (20 mg total) by mouth 2 (two) times daily. 07/28/16 07/28/17  Merrily Brittle, MD  ibuprofen (ADVIL) 800 MG tablet Take 1 tablet (800 mg total) by mouth every 8 (eight) hours as needed. 11/03/20   Menshew, Charlesetta Ivory, PA-C    Allergies Patient has no known allergies.  No family history on file.  Social History Social History   Tobacco Use   Smoking status: Every Day     Packs/day: 1.00    Types: Cigarettes   Smokeless tobacco: Never  Substance Use Topics   Alcohol use: Yes    Alcohol/week: 7.0 standard drinks    Types: 7 Cans of beer per week    Comment: occasional   Drug use: Yes    Types: Marijuana    Review of Systems Constitutional: Negative for fever. Cardiovascular: Negative for chest pain. Respiratory: Negative for shortness of breath. Musculoskeletal: Positive for left side anterior rib pain. Skin: Negative for contusions or lesions in the area of concern. Neurological: Negative for decrease in sensation  ____________________________________________   PHYSICAL EXAM:  VITAL SIGNS: ED Triage Vitals  Enc Vitals Group     BP 11/20/20 1025 (!) 135/93     Pulse Rate 11/20/20 1025 70     Resp 11/20/20 1025 17     Temp 11/20/20 1025 98.1 F (36.7 C)     Temp Source 11/20/20 1025 Oral     SpO2 11/20/20 1025 98 %     Weight 11/20/20 1022 139 lb 15.9 oz (63.5 kg)     Height 11/20/20 1022 5\' 6"  (1.676 m)     Head Circumference --      Peak Flow --      Pain Score --      Pain Loc --      Pain Edu? --      Excl. in GC? --  Constitutional: Alert and oriented. Well appearing and in no acute distress. Eyes: Conjunctivae are clear without discharge or drainage Head: Atraumatic Neck: Supple. Respiratory: No cough. Respirations are even and unlabored. Musculoskeletal: Focal tenderness of the anterior left rib around #4 5. Neurologic: Motor and sensory function is intact. Skin: No contusions or open wounds or lesions. Psychiatric: Affect and behavior are appropriate.  ____________________________________________   LABS (all labs ordered are listed, but only abnormal results are displayed)  Labs Reviewed - No data to display ____________________________________________  RADIOLOGY  Repeat imaging of the chest and left ribs negative for obvious fracture.  I, Kem Boroughs, personally viewed and evaluated these images (plain  radiographs) as part of my medical decision making, as well as reviewing the written report by the radiologist.  DG Ribs Unilateral W/Chest Left  Result Date: 11/20/2020 CLINICAL DATA:  Chest wall pain EXAM: LEFT RIBS AND CHEST - 3+ VIEW COMPARISON:  None. FINDINGS: No fracture or other bone lesions are seen involving the ribs. Unchanged mild dextrocurvature of the thoracic spine. There is no evidence of pneumothorax or pleural effusion. Both lungs are clear. Heart size and mediastinal contours are within normal limits. IMPRESSION: No evidence of displaced rib fracture. Electronically Signed   By: Allegra Lai M.D.   On: 11/20/2020 12:32   ____________________________________________   PROCEDURES  Procedures  ____________________________________________   INITIAL IMPRESSION / ASSESSMENT AND PLAN / ED COURSE  Allen Clay is a 35 y.o. who presents to the emergency department for treatment and evaluation of continued left-sided anterior chest wall pain.  He states that he leaned over a table to grab onto a piece of heavy equipment and pulled the equipment toward him.  He feels that he applied too much pressure onto the rib.  He has been having difficulty lifting anything with the left arm.  States that his work is not understanding and needed to have a repeat exam today because he left work.  Repeat imaging is negative for obvious rib fracture he will be treated with prednisone and methocarbamol for suspected costochondritis.  Patient instructed to follow-up with primary care.  He was also instructed to return to the emergency department for symptoms that change or worsen if unable schedule an appointment.  Medications - No data to display  Pertinent labs & imaging results that were available during my care of the patient were reviewed by me and considered in my medical decision making (see chart for details).   _________________________________________   FINAL CLINICAL IMPRESSION(S)  / ED DIAGNOSES  Final diagnoses:  Traumatic injury of rib    ED Discharge Orders          Ordered    predniSONE (STERAPRED UNI-PAK 21 TAB) 10 MG (21) TBPK tablet        11/20/20 1253    methocarbamol (ROBAXIN) 500 MG tablet  4 times daily        11/20/20 1253             If controlled substance prescribed during this visit, 12 month history viewed on the [facility] prior to issuing an initial prescription for Schedule II or III opiod.    Chinita Pester, FNP 11/20/20 1552    Jene Every, MD 11/23/20 [phone]

## 2021-10-30 IMAGING — CR DG RIBS W/ CHEST 3+V*L*
1 series · 3 of 3 positions shown · non-contrast
Comparison: None.

CLINICAL DATA: Chest wall pain

EXAM:
LEFT RIBS AND CHEST - 3+ VIEW

[Series 1: dg ribs unilateral w/chest left · 0.14mm/px · 3 of 3 slices shown]
[im 1/3]
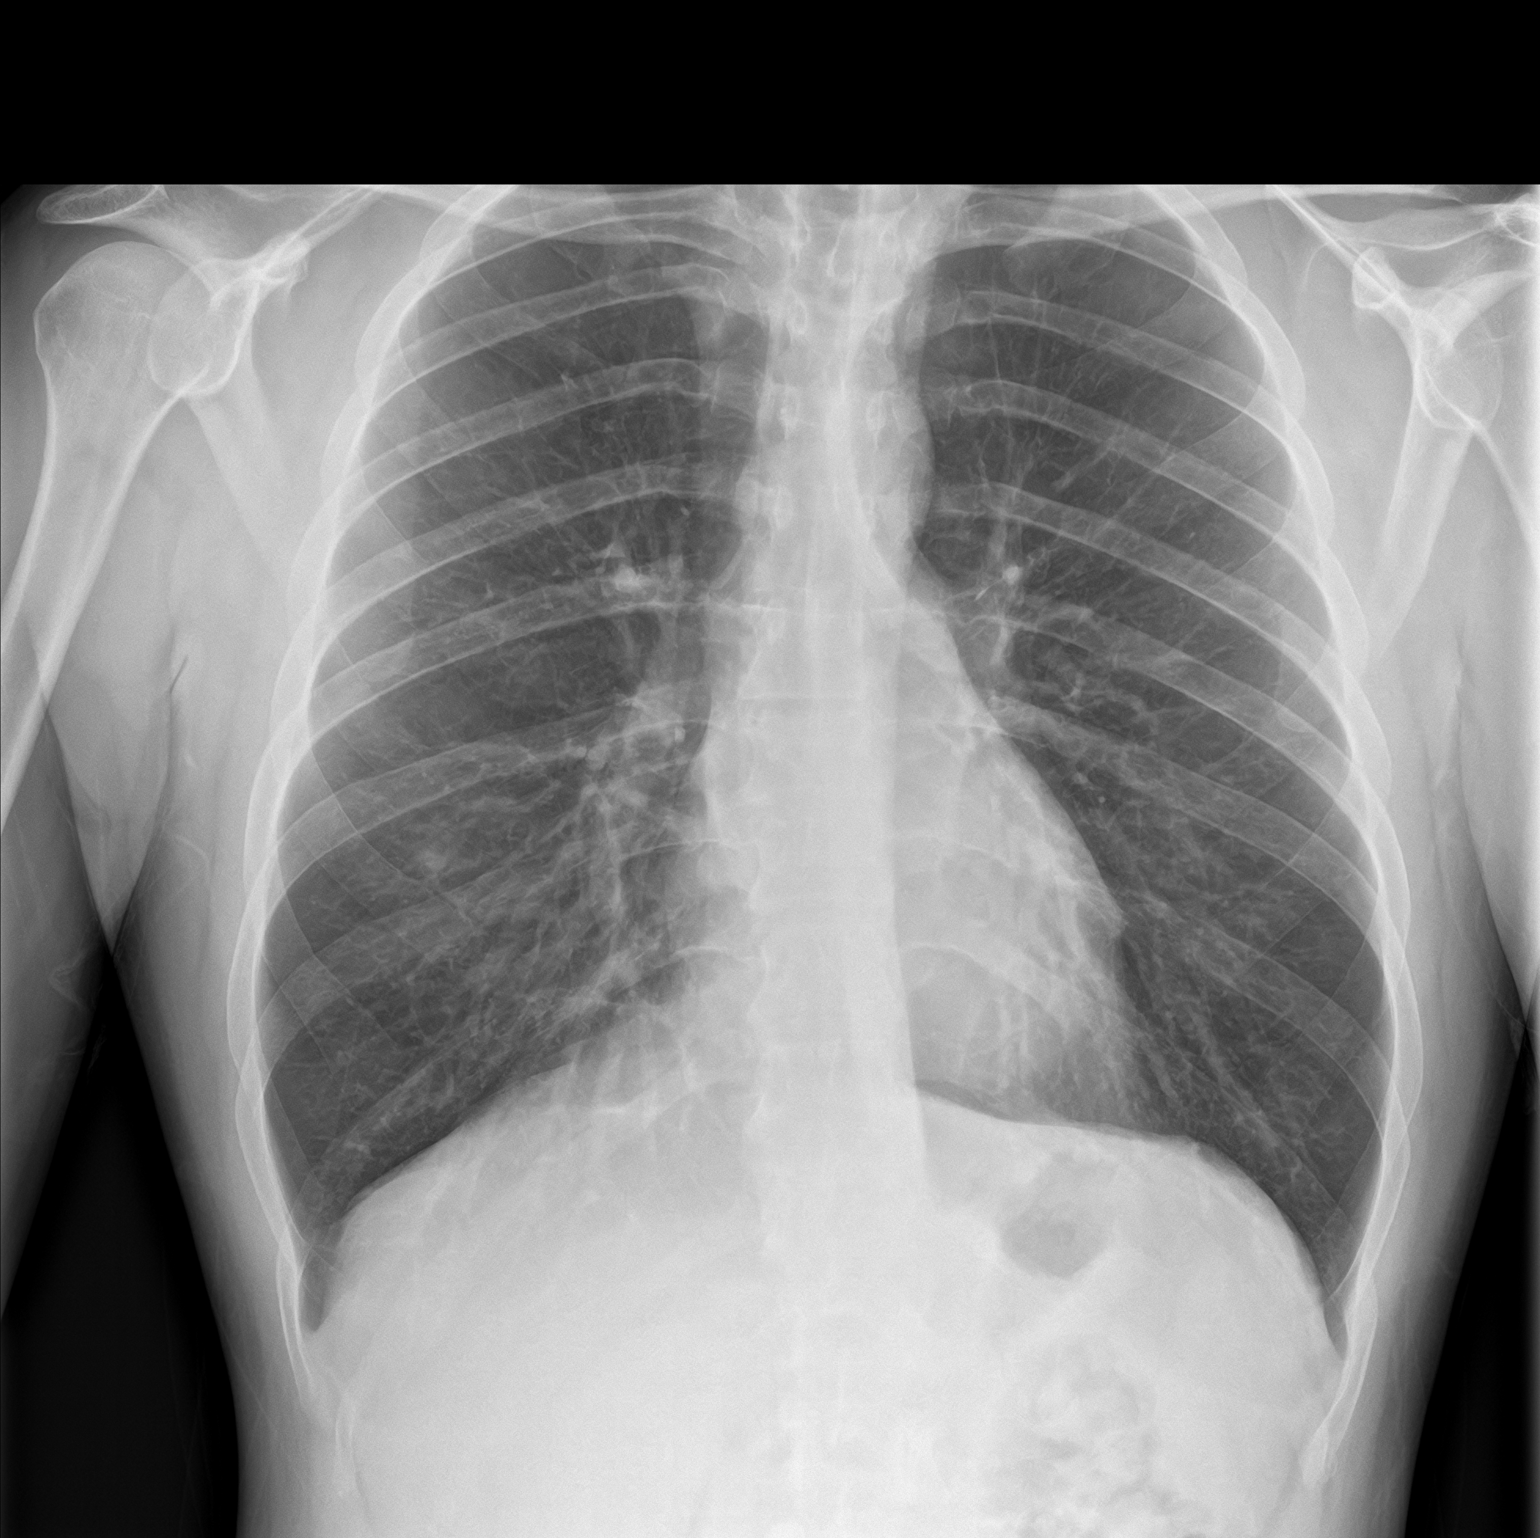
[im 2/3]
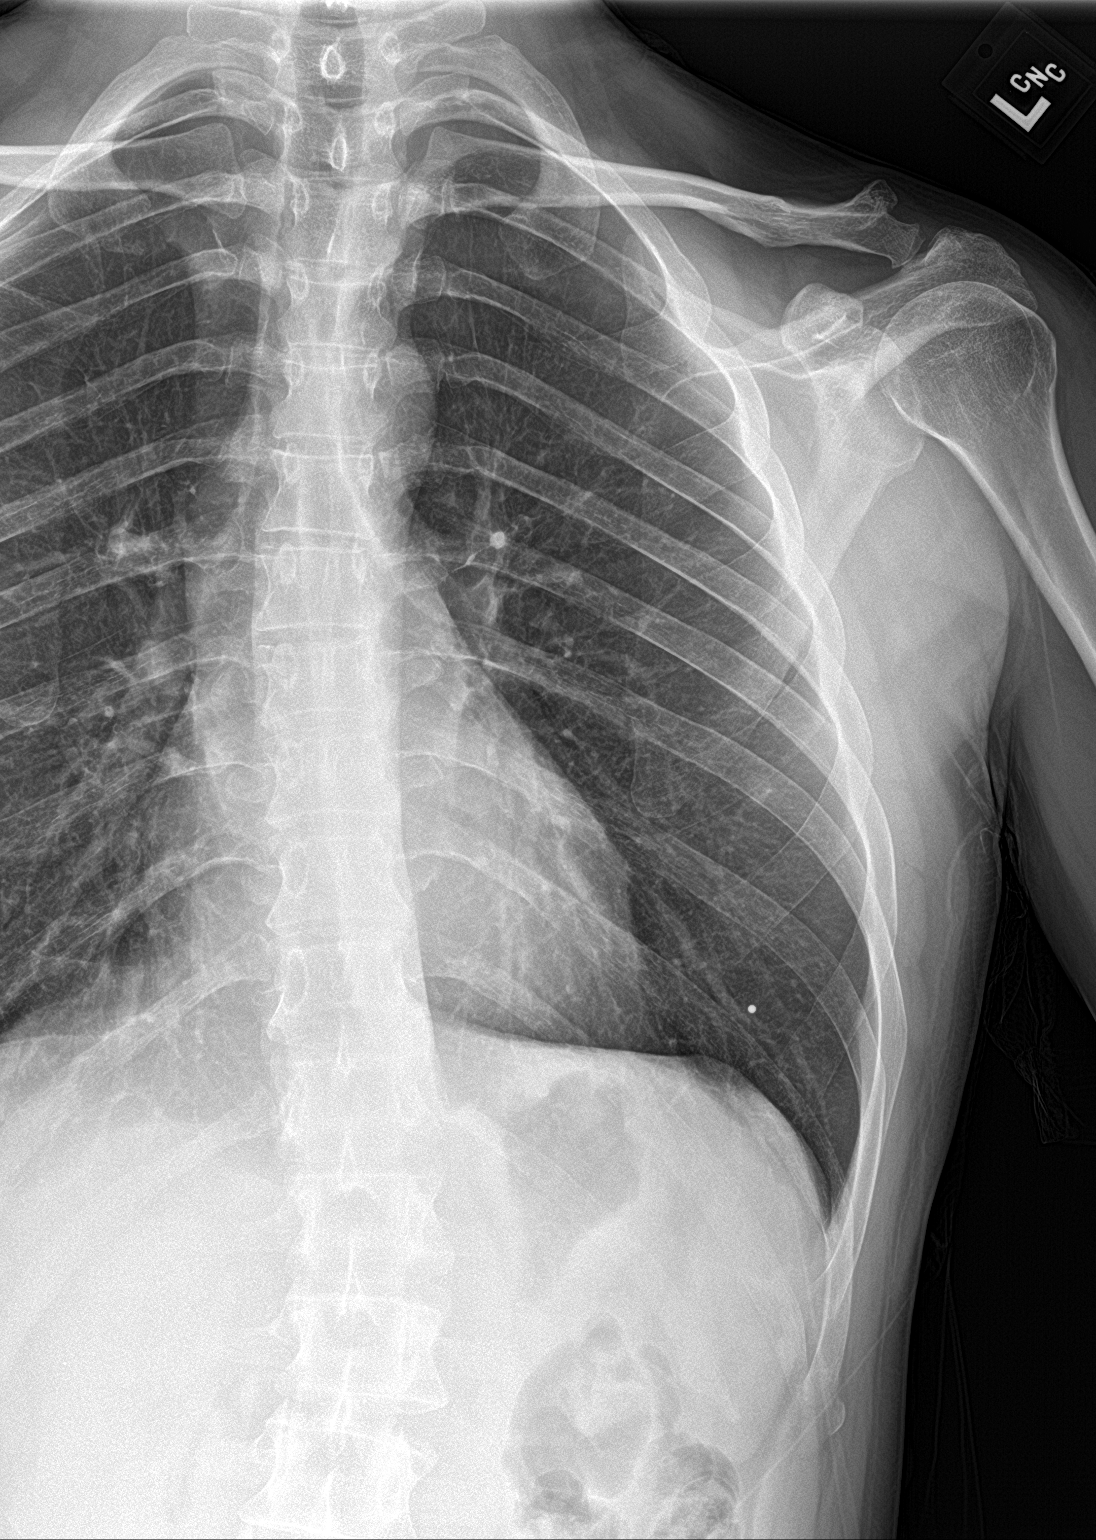
[im 3/3]
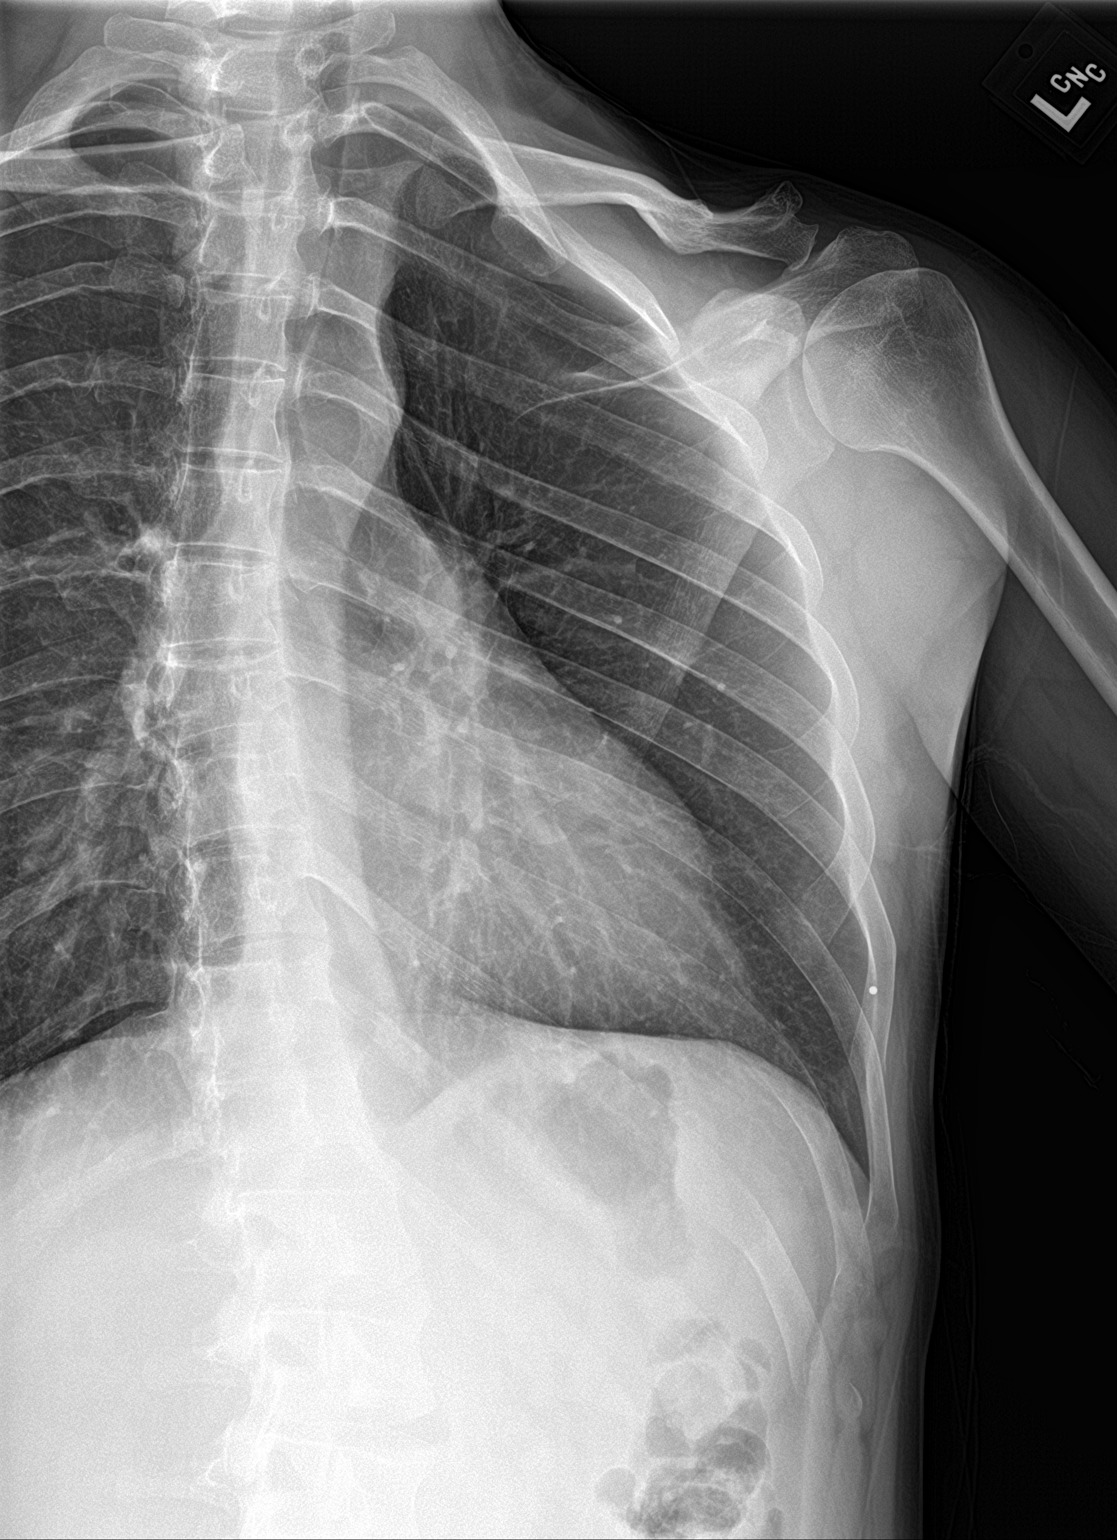

[3 of 3 positions shown; findings below may reference images not displayed]

FINDINGS: No fracture or other bone lesions are seen involving the ribs.
Unchanged mild dextrocurvature of the thoracic spine. There is no
evidence of pneumothorax or pleural effusion. Both lungs are clear.
Heart size and mediastinal contours are within normal limits.
IMPRESSION: No evidence of displaced rib fracture.

## 2023-05-12 ENCOUNTER — Ambulatory Visit: Payer: Medicaid Other | Admitting: Family Medicine

## 2023-05-12 VITALS — BP 119/82 | HR 72 | Ht 66.0 in | Wt 140.5 lb

## 2023-05-12 DIAGNOSIS — F1911 Other psychoactive substance abuse, in remission: Secondary | ICD-10-CM

## 2023-05-12 DIAGNOSIS — Z7689 Persons encountering health services in other specified circumstances: Secondary | ICD-10-CM

## 2023-05-12 DIAGNOSIS — Z1159 Encounter for screening for other viral diseases: Secondary | ICD-10-CM

## 2023-05-12 DIAGNOSIS — Z130 Encounter for screening for diseases of the blood and blood-forming organs and certain disorders involving the immune mechanism: Secondary | ICD-10-CM

## 2023-05-12 DIAGNOSIS — K4090 Unilateral inguinal hernia, without obstruction or gangrene, not specified as recurrent: Secondary | ICD-10-CM | POA: Diagnosis not present

## 2023-05-12 DIAGNOSIS — Z114 Encounter for screening for human immunodeficiency virus [HIV]: Secondary | ICD-10-CM | POA: Diagnosis not present

## 2023-05-12 DIAGNOSIS — Z13220 Encounter for screening for lipoid disorders: Secondary | ICD-10-CM

## 2023-05-12 DIAGNOSIS — I83811 Varicose veins of right lower extremities with pain: Secondary | ICD-10-CM | POA: Diagnosis not present

## 2023-05-12 DIAGNOSIS — J302 Other seasonal allergic rhinitis: Secondary | ICD-10-CM

## 2023-05-12 DIAGNOSIS — Z1329 Encounter for screening for other suspected endocrine disorder: Secondary | ICD-10-CM | POA: Diagnosis not present

## 2023-05-12 DIAGNOSIS — R5382 Chronic fatigue, unspecified: Secondary | ICD-10-CM

## 2023-05-12 DIAGNOSIS — R1031 Right lower quadrant pain: Secondary | ICD-10-CM | POA: Diagnosis not present

## 2023-05-12 DIAGNOSIS — R0602 Shortness of breath: Secondary | ICD-10-CM

## 2023-05-12 DIAGNOSIS — Z716 Tobacco abuse counseling: Secondary | ICD-10-CM | POA: Diagnosis not present

## 2023-05-12 DIAGNOSIS — R071 Chest pain on breathing: Secondary | ICD-10-CM | POA: Diagnosis not present

## 2023-05-12 DIAGNOSIS — F331 Major depressive disorder, recurrent, moderate: Secondary | ICD-10-CM

## 2023-05-12 DIAGNOSIS — F17200 Nicotine dependence, unspecified, uncomplicated: Secondary | ICD-10-CM

## 2023-05-12 DIAGNOSIS — F411 Generalized anxiety disorder: Secondary | ICD-10-CM

## 2023-05-12 DIAGNOSIS — Z13228 Encounter for screening for other metabolic disorders: Secondary | ICD-10-CM | POA: Diagnosis not present

## 2023-05-12 MED ORDER — ALBUTEROL SULFATE HFA 108 (90 BASE) MCG/ACT IN AERS: 2.0000 | INHALATION_SPRAY | Freq: Four times a day (QID) | RESPIRATORY_TRACT | 2 refills | Status: AC | PRN

## 2023-05-12 MED ORDER — NICOTINE 14 MG/24HR TD PT24: 14.0000 mg | MEDICATED_PATCH | Freq: Every day | TRANSDERMAL | 0 refills | Status: AC

## 2023-05-12 MED ORDER — CETIRIZINE HCL 10 MG PO TABS: 10.0000 mg | ORAL_TABLET | Freq: Every day | ORAL | 3 refills | Status: AC

## 2023-05-12 MED ORDER — NICOTINE POLACRILEX 2 MG MT LOZG: 2.0000 mg | LOZENGE | OROMUCOSAL | 0 refills | Status: AC | PRN

## 2023-05-12 NOTE — Progress Notes (Unsigned)
 New patient visit   Patient: Allen Clay   DOB: [birth year]   37 y.o. Male  MRN: [id number] Visit Date: 05/12/2023  Today's healthcare provider: Carlean Charter, DO   Chief Complaint  Patient presents with   New Patient (Initial Visit)   Subjective    Allen Clay is a 38 y.o. male who presents today as a new patient to establish care.   HPI  Allen Clay is a 38 year old male who presents with concerns about leg pain and varicose veins.  He has varicose veins in the right leg, which have progressively worsened over the past year, moving upwards from the lower leg. He experiences severe cramping, particularly at night, requiring ambulation for relief. Swelling and heaviness in the leg are present, sometimes preventing him from putting pressure on it due to pain. 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 Compression socks are used, though he is concerned he sometimes leaves them on too long. Symptoms are more pronounced on the right side, with occasional mild discomfort on the left.  He smokes about half a pack of cigarettes per day and wants to quit, having reduced his intake from a full pack a day over the past few months. He has not tried any cessation aids yet but is considering options. He has smoked since age 49, initially smoking two packs a day until about age 29. He occasionally uses marijuana, primarily to aid sleep, but has significantly reduced his usage recently.  He experiences episodes of chest pain and shortness of breath, particularly during physical activity, which he attributes to smoking and exertion. He describes the sensation as feeling like he can't breathe and needing to stop activities to catch his breath. The duration of these symptoms is unspecified.  He reports random abdominal pain exacerbated by certain positions, such as sitting, severe enough to prevent comfortable sitting.   He has a history of sinus issues, particularly during  pollen season, and uses Flonase occasionally. He has tried Claritin in the past with some relief and is open to trying other allergy medications.  He has a history of anxiety and depression, having been on medications about 15 years ago, including lithium, which he discontinued due to concerns about long-term effects. He is interested in speaking with a counselor or therapist.  He mentions past use of illicitly-obtained pills he believed to be Percocet, which contained fentanyl, and has been off them for about five months. He reports feeling not right since stopping them, although he is unsure if this is related to his current symptoms.  He notes he originally took these in an effort to try to address his pain.    Past Medical History:  Diagnosis Date   Allergy    Anxiety    Bipolar 1 disorder (HCC)    Depression    History reviewed. No pertinent surgical history. Family Status  Relation Name Status   Mother  Alive   Father  Deceased  No partnership data on file   Family History  Problem Relation Age of Onset   Kidney disease Mother    Social History   Socioeconomic History   Marital status: Single    Spouse name: Not on file   Number of children: Not on file   Years of education: Not on file   Highest education level: GED or equivalent  Occupational History   Not on file  Tobacco Use   Smoking status: Every Day  Current packs/day: 0.50    Average packs/day: 1.5 packs/day for 21.3 years (31.1 ttl pk-yrs)    Types: Cigarettes    Start date: 2004   Smokeless tobacco: Never  Substance and Sexual Activity   Alcohol use: Yes    Alcohol/week: 5.0 standard drinks of alcohol    Types: 5 Cans of beer per week    Comment: occasional   Drug use: Yes    Types: Marijuana   Sexual activity: Not on file  Other Topics Concern   Not on file  Social History Narrative   Not on file   Social Drivers of Health   Financial Resource Strain: Low Risk  (05/12/2023)   Overall  Financial Resource Strain (CARDIA)    Difficulty of Paying Living Expenses: Not very hard  Food Insecurity: No Food Insecurity (05/12/2023)   Hunger Vital Sign    Worried About Running Out of Food in the Last Year: Never true    Ran Out of Food in the Last Year: Never true  Transportation Needs: No Transportation Needs (05/12/2023)   PRAPARE - Administrator, Civil Service (Medical): No    Lack of Transportation (Non-Medical): No  Physical Activity: Sufficiently Active (05/12/2023)   Exercise Vital Sign    Days of Exercise per Week: 6 days    Minutes of Exercise per Session: 150+ min  Stress: Stress Concern Present (05/12/2023)   [employer] of Occupational Health - Occupational Stress Questionnaire    Feeling of Stress : To some extent  Social Connections: Socially Isolated (05/12/2023)   Social Connection and Isolation Panel [NHANES]    Frequency of Communication with Friends and Family: Three times a week    Frequency of Social Gatherings with Friends and Family: Twice a week    Attends Religious Services: Never    Database administrator or Organizations: No    Attends Engineer, structural: Not on file    Marital Status: Never married   Outpatient Medications Prior to Visit  Medication Sig   benzonatate (TESSALON PERLES) 100 MG capsule Take 1 capsule (100 mg total) by mouth every 6 (six) hours as needed for cough. (Patient not taking: Reported on 05/12/2023)   cyclobenzaprine (FLEXERIL) 5 MG tablet Take 1 tablet (5 mg total) by mouth 3 (three) times daily as needed. (Patient not taking: Reported on 05/12/2023)   famotidine (PEPCID) 20 MG tablet Take 1 tablet (20 mg total) by mouth 2 (two) times daily.   ibuprofen (ADVIL) 800 MG tablet Take 1 tablet (800 mg total) by mouth every 8 (eight) hours as needed. (Patient not taking: Reported on 05/12/2023)   methocarbamol (ROBAXIN) 500 MG tablet Take 1 tablet (500 mg total) by mouth 4 (four) times daily. (Patient not taking:  Reported on 05/12/2023)   predniSONE (STERAPRED UNI-PAK 21 TAB) 10 MG (21) TBPK tablet Take 6 tablets on the first day and decrease by 1 tablet each day until finished. (Patient not taking: Reported on 05/12/2023)   No facility-administered medications prior to visit.   No Known Allergies   There is no immunization history on file for this patient.  Health Maintenance  Topic Date Due   DTaP/Tdap/Td (1 - Tdap) 05/29/2023 (Originally 07/24/2006)   Pneumococcal Vaccine 82-36 Years old (1 of 2 - PCV) 07/16/2023 (Originally 07/24/2006)   COVID-19 Vaccine (1 - 2024-25 season) 11/08/2023 (Originally 10/09/2022)   INFLUENZA VACCINE  09/08/2023   Hepatitis C Screening  Completed   HIV Screening  Completed   HPV  VACCINES  Aged Out   Meningococcal B Vaccine  Aged Out    Patient Care Team: Costella Schwarz, Asencion Blacksmith, DO as PCP - General (Family Medicine)  Review of Systems  {Insert previous labs (optional):23779} {See past labs  Heme  Chem  Endocrine  Serology  Results Review (optional):1}   Objective    BP 119/82   Pulse 72   Ht 5\' 6"  (1.676 m)   Wt 140 lb 8 oz (63.7 kg)   SpO2 98%   BMI 22.68 kg/m  {Insert last BP/Wt (optional):23777}{See vitals history (optional):1}   Physical Exam   Depression Screen    05/12/2023    3:25 PM  PHQ 2/9 Scores  PHQ - 2 Score 2  PHQ- 9 Score 14   Results for orders placed or performed in visit on 05/12/23  Comprehensive metabolic panel with GFR  Result Value Ref Range   Glucose 82 70 - 99 mg/dL   BUN 11 6 - 20 mg/dL   Creatinine, Ser 1.61 0.76 - 1.27 mg/dL   eGFR 80 >09 UE/AVW/0.98   BUN/Creatinine Ratio 9 9 - 20   Sodium 141 134 - 144 mmol/L   Potassium 4.8 3.5 - 5.2 mmol/L   Chloride 105 96 - 106 mmol/L   CO2 24 20 - 29 mmol/L   Calcium 8.9 8.7 - 10.2 mg/dL   Total Protein 5.7 (L) 6.0 - 8.5 g/dL   Albumin 3.6 (L) 4.1 - 5.1 g/dL   Globulin, Total 2.1 1.5 - 4.5 g/dL   Bilirubin Total 0.3 0.0 - 1.2 mg/dL   Alkaline Phosphatase 54 44 - 121  IU/L   AST 33 0 - 40 IU/L   ALT 26 0 - 44 IU/L  Lipid panel  Result Value Ref Range   Cholesterol, Total 156 100 - 199 mg/dL   Triglycerides 84 0 - 149 mg/dL   HDL 56 >11 mg/dL   VLDL Cholesterol Cal 16 5 - 40 mg/dL   LDL Chol Calc (NIH) 84 0 - 99 mg/dL   Chol/HDL Ratio 2.8 0.0 - 5.0 ratio  VITAMIN D 25 Hydroxy (Vit-D Deficiency, Fractures)  Result Value Ref Range   Vit D, 25-Hydroxy 22.3 (L) 30.0 - 100.0 ng/mL  TSH Rfx on Abnormal to Free T4  Result Value Ref Range   TSH 1.300 0.450 - 4.500 uIU/mL  HIV Antibody (routine testing w rflx)  Result Value Ref Range   HIV Screen 4th Generation wRfx Non Reactive Non Reactive  HCV Ab w Reflex to Quant PCR  Result Value Ref Range   HCV Ab Non Reactive Non Reactive  Vitamin B12  Result Value Ref Range   Vitamin B-12 162 (L) 232 - 1,245 pg/mL  CBC  Result Value Ref Range   WBC 5.9 3.4 - 10.8 x10E3/uL   RBC 4.31 4.14 - 5.80 x10E6/uL   Hemoglobin 14.0 13.0 - 17.7 g/dL   Hematocrit 91.4 78.2 - 51.0 %   MCV 97 79 - 97 fL   MCH 32.5 26.6 - 33.0 pg   MCHC 33.7 31.5 - 35.7 g/dL   RDW 95.6 21.3 - 08.6 %   Platelets 269 150 - 450 x10E3/uL  Interpretation:  Result Value Ref Range   HCV Interp 1: Comment     Assessment & Plan     Varicose veins of right lower extremity with pain  Establishing care with new doctor, encounter for  Chest pain on breathing  Shortness of breath -     Pulmonary Function Test; Future -  Albuterol Sulfate HFA; Inhale 2 puffs into the lungs every 6 (six) hours as needed for wheezing or shortness of breath.  Dispense: 8 g; Refill: 2  Seasonal allergies -     Cetirizine HCl; Take 1 tablet (10 mg total) by mouth at bedtime.  Dispense: 90 tablet; Refill: 3  Generalized anxiety disorder -     Ambulatory referral to Psychiatry  Right inguinal hernia -     US  PELVIS LIMITED (TRANSABDOMINAL ONLY); Future  Right inguinal pain  Chronic fatigue -     VITAMIN D 25 Hydroxy (Vit-D Deficiency, Fractures) -      TSH Rfx on Abnormal to Free T4 -     Vitamin B12  Nicotine dependence with current use -     Nicotine; Place 1 patch (14 mg total) onto the skin daily.  Dispense: 28 patch; Refill: 0 -     Nicotine Polacrilex; Take 1 lozenge (2 mg total) by mouth as needed for smoking cessation.  Dispense: 100 tablet; Refill: 0  History of substance abuse (HCC)  Encounter for smoking cessation counseling -     Nicotine; Place 1 patch (14 mg total) onto the skin daily.  Dispense: 28 patch; Refill: 0 -     Nicotine Polacrilex; Take 1 lozenge (2 mg total) by mouth as needed for smoking cessation.  Dispense: 100 tablet; Refill: 0  Screening for deficiency anemia -     CBC  Screening for endocrine, metabolic and immunity disorder -     Comprehensive metabolic panel with GFR  Screening for lipid disorders -     Lipid panel  Encounter for hepatitis C screening test for low risk patient -     HCV Ab w Reflex to Quant PCR -     Interpretation:  Encounter for screening for HIV -     HIV Antibody (routine testing w rflx)    Varicose Veins Chronic varicose veins with pain, cramping, and swelling. Compression stockings beneficial. - Advise continued use of compression stockings.  Chest pain; shortness of breath Intermittent right-sided chest pain with dyspnea, likely non-cardiac. Differential includes musculoskeletal pain or anxiety-related symptoms. - Order pulmonary function test and chest X-ray. - Consider inhaler if pulmonary function test indicates need.  Inguinal Hernia Intermittent right-sided groin pain, possible inguinal hernia. No palpable lump. - Order ultrasound of the groin. - Consider surgical referral based on ultrasound findings.  Nicotine Dependence; smoking cessation counseling Long-standing nicotine dependence, currently smoking half a pack per day. Prefers nicotine patches and lozenges for cessation. - Prescribe nicotine patches (14 mg) and lozenges. - Plan follow-up in one  month to assess progress.  Anxiety Anxiety with dyspnea and panic attacks. Interested in counseling and possibly medication. - Refer to psychiatry for counseling and potential medication management. - Discussed potential benefits of bupropion for anxiety and smoking cessation.  Allergic Rhinitis Chronic symptoms consistent with allergic rhinitis, exacerbated by pollen. Previous relief with Flonase. - Prescribe cetirizine 10 mg daily at bedtime. - Advise follow-up if symptoms persist or worsen.  Substance Use History Accidental Fentanyl use ceased five months ago. Naltrexone discussed but unnecessary.  Establishing care with a new doctor, encounter for Discussed vaccinations. Against COVID-19 vaccination. Eligible for pneumonia vaccine due to smoking history. - Recommend pneumonia vaccine at next visit or pharmacy.   Follow-up Follow-up planned to assess progress with nicotine cessation, evaluate test results, and address ongoing issues. - Scheduled for May 12th at 11 AM. - Ensure he receives calls for scheduling ultrasound and psychiatry referral.  Return in about 1 month (around 06/11/2023) for Anx/Dep, nicotine, DOE.     I discussed the assessment and treatment plan with the patient  The patient was provided an opportunity to ask questions and all were answered. The patient agreed with the plan and demonstrated an understanding of the instructions.   The patient was advised to call back or seek an in-person evaluation if the symptoms worsen or if the condition fails to improve as anticipated.    Carlean Charter, DO  [facility] [place] [facility] ([facility]) [phone] (phone) [phone] (fax)  [facility]

## 2023-05-13 LAB — CBC
Hematocrit: 41.6 % (ref 37.5–51.0)
Hemoglobin: 14 g/dL (ref 13.0–17.7)
MCH: 32.5 pg (ref 26.6–33.0)
MCHC: 33.7 g/dL (ref 31.5–35.7)
MCV: 97 fL (ref 79–97)
Platelets: 269 10*3/uL (ref 150–450)
RBC: 4.31 x10E6/uL (ref 4.14–5.80)
RDW: 11.6 % (ref 11.6–15.4)
WBC: 5.9 10*3/uL (ref 3.4–10.8)

## 2023-05-13 LAB — COMPREHENSIVE METABOLIC PANEL WITH GFR
ALT: 26 IU/L (ref 0–44)
AST: 33 IU/L (ref 0–40)
Albumin: 3.6 g/dL — ABNORMAL LOW (ref 4.1–5.1)
Alkaline Phosphatase: 54 IU/L (ref 44–121)
BUN/Creatinine Ratio: 9 (ref 9–20)
BUN: 11 mg/dL (ref 6–20)
Bilirubin Total: 0.3 mg/dL (ref 0.0–1.2)
CO2: 24 mmol/L (ref 20–29)
Calcium: 8.9 mg/dL (ref 8.7–10.2)
Chloride: 105 mmol/L (ref 96–106)
Creatinine, Ser: 1.21 mg/dL (ref 0.76–1.27)
Globulin, Total: 2.1 g/dL (ref 1.5–4.5)
Glucose: 82 mg/dL (ref 70–99)
Potassium: 4.8 mmol/L (ref 3.5–5.2)
Sodium: 141 mmol/L (ref 134–144)
Total Protein: 5.7 g/dL — ABNORMAL LOW (ref 6.0–8.5)
eGFR: 80 mL/min/{1.73_m2} (ref >59)

## 2023-05-13 LAB — HCV AB W REFLEX TO QUANT PCR: HCV Ab: NONREACTIVE

## 2023-05-13 LAB — HIV ANTIBODY (ROUTINE TESTING W REFLEX): HIV Screen 4th Generation wRfx: NONREACTIVE

## 2023-05-13 LAB — LIPID PANEL
Chol/HDL Ratio: 2.8 ratio (ref 0.0–5.0)
Cholesterol, Total: 156 mg/dL (ref 100–199)
HDL: 56 mg/dL (ref >39)
LDL Chol Calc (NIH): 84 mg/dL (ref 0–99)
Triglycerides: 84 mg/dL (ref 0–149)
VLDL Cholesterol Cal: 16 mg/dL (ref 5–40)

## 2023-05-13 LAB — HCV INTERPRETATION

## 2023-05-13 LAB — VITAMIN D 25 HYDROXY (VIT D DEFICIENCY, FRACTURES): Vit D, 25-Hydroxy: 22.3 ng/mL — ABNORMAL LOW (ref 30.0–100.0)

## 2023-05-13 LAB — VITAMIN B12: Vitamin B-12: 162 pg/mL — ABNORMAL LOW (ref 232–1245)

## 2023-05-13 LAB — TSH RFX ON ABNORMAL TO FREE T4: TSH: 1.3 u[IU]/mL (ref 0.450–4.500)

## 2023-05-23 ENCOUNTER — Ambulatory Visit: Attending: Family Medicine

## 2023-05-24 ENCOUNTER — Encounter: Payer: Self-pay | Admitting: Family Medicine

## 2023-05-25 ENCOUNTER — Ambulatory Visit: Admitting: Psychiatry

## 2023-06-19 ENCOUNTER — Ambulatory Visit: Admitting: Family Medicine

## 2023-12-15 ENCOUNTER — Telehealth: Payer: Self-pay

## 2023-12-15 DIAGNOSIS — J069 Acute upper respiratory infection, unspecified: Secondary | ICD-10-CM | POA: Diagnosis not present

## 2023-12-15 DIAGNOSIS — Z7189 Other specified counseling: Secondary | ICD-10-CM

## 2023-12-15 NOTE — Progress Notes (Signed)
 Complex Care Management Note  Care Guide Note 12/15/2023 Name: ZAMEER BORMAN MRN: [id number] DOB: 13-Feb-1985  Deloris CHRISTELLA Placencia is a 38 y.o. year old male who sees Pardue, Lauraine SAILOR, DO for primary care. I reached out to Deloris CHRISTELLA Pereyra by phone today to offer complex care management services.  Mr. Corsino was given information about Complex Care Management services today including:   The Complex Care Management services include support from the care team which includes your Nurse Care Manager, Clinical Social Worker, or Pharmacist.  The Complex Care Management team is here to help remove barriers to the health concerns and goals most important to you. Complex Care Management services are voluntary, and the patient may decline or stop services at any time by request to their care team member.   Complex Care Management Consent Status: Patient agreed to services and verbal consent obtained.   Follow up plan:  Telephone appointment with complex care management team member scheduled for:  01/03/2024  Encounter Outcome:  Patient Scheduled  Jeoffrey Buffalo , RMA     [place]  [facility], [facility] Guide  Direct Dial: [phone]  Website: [email or web address]

## 2023-12-26 ENCOUNTER — Ambulatory Visit: Admitting: Family Medicine

## 2024-01-02 ENCOUNTER — Telehealth: Payer: Self-pay

## 2024-01-02 NOTE — Progress Notes (Signed)
 Complex Care Management Care Guide Note  01/02/2024 Name: CHAKA JEFFERYS MRN: [id number] DOB: February 02, 1986  Deloris CHRISTELLA Gervin is a 38 y.o. year old male who is a primary care patient of Pardue, Lauraine SAILOR, DO and is actively engaged with the care management team. I reached out to Deloris CHRISTELLA Pereyra by phone today to assist with re-scheduling  with the RN Case Manager.  Follow up plan: Unsuccessful outreach to reschedule patient with RNCM. Unable to leave confidential voicemail for patient to return call. Will make additional attempt.  Dreama [facility]  [facility], [facility] VBCI Assistant Direct Dial: [phone]  Fax: [phone]

## 2024-01-02 NOTE — Progress Notes (Unsigned)
 Complex Care Management Note Care Guide Note  01/02/2024 Name: Allen Clay MRN: [id number] DOB: Jul 08, 1985   Complex Care Management Outreach Attempts: An unsuccessful telephone outreach was attempted today to offer the patient information about available complex care management services.  Follow Up Plan:  Additional outreach attempts will be made to offer the patient complex care management information and services.   Encounter Outcome:  No Answer-No Voicemail  Leotis Rase [facility], [facility] Guide  Direct Dial: [phone]  Fax [phone]

## 2024-01-03 ENCOUNTER — Telehealth

## 2024-01-03 NOTE — Progress Notes (Signed)
 Complex Care Management Care Guide Note  01/03/2024 Name: Allen Clay MRN: [id number] DOB: Aug 26, 1985  Allen Clay is a 38 y.o. year old male who is a primary care patient of Pardue, Lauraine SAILOR, DO and is actively engaged with the care management team. I reached out to Allen Clay by phone today to assist with re-scheduling  with the RN Case Manager.  Follow up plan: Unsuccessful telephone outreach attempt made. A HIPAA compliant phone message was left for the patient providing contact information and requesting a return call.  Leotis Rase [facility], [facility] Guide  Direct Dial: [phone]  Fax [phone]

## 2024-02-14 ENCOUNTER — Ambulatory Visit: Admitting: Family Medicine
